# Patient Record
Sex: Female | Born: 2000
Health system: Southern US, Community
[De-identification: ages and names within clinical notes are randomized; demographics above are authoritative.]

## PROBLEM LIST (undated history)

## (undated) DIAGNOSIS — J45909 Unspecified asthma, uncomplicated: Secondary | ICD-10-CM

---

## 2018-05-16 ENCOUNTER — Encounter (HOSPITAL_COMMUNITY): Payer: Self-pay

## 2018-05-16 ENCOUNTER — Emergency Department (HOSPITAL_COMMUNITY): Payer: Self-pay

## 2018-05-16 ENCOUNTER — Emergency Department (HOSPITAL_COMMUNITY)
Admission: EM | Admit: 2018-05-16 | Discharge: 2018-05-16 | Disposition: A | Discharge status: Home / Self Care | Payer: Self-pay | Attending: Emergency Medicine | Admitting: Emergency Medicine

## 2018-05-16 DIAGNOSIS — Y999 Unspecified external cause status: Secondary | ICD-10-CM | POA: Insufficient documentation

## 2018-05-16 DIAGNOSIS — S90811A Abrasion, right foot, initial encounter: Secondary | ICD-10-CM | POA: Insufficient documentation

## 2018-05-16 DIAGNOSIS — J45909 Unspecified asthma, uncomplicated: Secondary | ICD-10-CM | POA: Insufficient documentation

## 2018-05-16 DIAGNOSIS — S80211A Abrasion, right knee, initial encounter: Secondary | ICD-10-CM | POA: Insufficient documentation

## 2018-05-16 DIAGNOSIS — Y9389 Activity, other specified: Secondary | ICD-10-CM | POA: Insufficient documentation

## 2018-05-16 DIAGNOSIS — T07XXXA Unspecified multiple injuries, initial encounter: Secondary | ICD-10-CM

## 2018-05-16 DIAGNOSIS — Z23 Encounter for immunization: Secondary | ICD-10-CM | POA: Insufficient documentation

## 2018-05-16 DIAGNOSIS — Y929 Unspecified place or not applicable: Secondary | ICD-10-CM | POA: Insufficient documentation

## 2018-05-16 DIAGNOSIS — S9031XA Contusion of right foot, initial encounter: Secondary | ICD-10-CM | POA: Insufficient documentation

## 2018-05-16 HISTORY — DX: Unspecified asthma, uncomplicated: J45.909

## 2018-05-16 MED ORDER — BACITRACIN ZINC 500 UNIT/GM EX OINT
TOPICAL_OINTMENT | Freq: Once | CUTANEOUS | Status: AC
  Administered 2018-05-16: 05:00:00 via TOPICAL
  Filled 2018-05-16: qty 0.9

## 2018-05-16 MED ORDER — TETANUS-DIPHTH-ACELL PERTUSSIS 5-2.5-18.5 LF-MCG/0.5 IM SUSP
0.5000 mL | Freq: Once | INTRAMUSCULAR | Status: AC
  Administered 2018-05-16: 0.5 mL via INTRAMUSCULAR
  Filled 2018-05-16: qty 0.5
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
## 2018-05-16 NOTE — Discharge Instructions (Signed)
You may alternate Tylenol 1000 mg every 6 hours as needed for pain and Ibuprofen 800 mg every 8 hours as needed for pain.  Please take Ibuprofen with food. ° °

## 2018-05-16 NOTE — ED Notes (Signed)
Patient verbalizes understanding of discharge instructions. Opportunity for questioning and answers were provided. Armband removed by staff, pt discharged from ED.  
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
## 2018-07-05 ENCOUNTER — Other Ambulatory Visit: Payer: Self-pay

## 2018-07-05 ENCOUNTER — Emergency Department (HOSPITAL_COMMUNITY)
Admission: EM | Admit: 2018-07-05 | Discharge: 2018-07-05 | Disposition: A | Discharge status: Home / Self Care | Payer: PRIVATE HEALTH INSURANCE | Attending: Emergency Medicine | Admitting: Emergency Medicine

## 2018-07-05 ENCOUNTER — Encounter (HOSPITAL_COMMUNITY): Payer: Self-pay | Admitting: Emergency Medicine

## 2018-07-05 DIAGNOSIS — L509 Urticaria, unspecified: Secondary | ICD-10-CM | POA: Diagnosis not present

## 2018-07-05 DIAGNOSIS — J45909 Unspecified asthma, uncomplicated: Secondary | ICD-10-CM | POA: Insufficient documentation

## 2018-07-05 DIAGNOSIS — R21 Rash and other nonspecific skin eruption: Secondary | ICD-10-CM | POA: Diagnosis present

## 2018-07-05 MED ORDER — DEXAMETHASONE SODIUM PHOSPHATE 10 MG/ML IJ SOLN
10.0000 mg | Freq: Once | INTRAMUSCULAR | Status: DC
  Filled 2018-07-05: qty 1

## 2018-07-05 MED ORDER — PREDNISONE 20 MG PO TABS: 40.0000 mg | ORAL_TABLET | Freq: Every day | ORAL | 0 refills | Status: AC

## 2018-07-05 NOTE — ED Provider Notes (Signed)
[facility] EMERGENCY DEPARTMENT Provider Note   CSN: [id number] Arrival date & time: 07/05/18  2208     History   Chief Complaint Chief Complaint  Patient presents with  . Urticaria    HPI Elizabeth Finley is a 17 y.o. female.  Patient presents to the emergency department with a chief complaint of hives.  She states she noticed the hives about 2 days ago.  She denies any changes in medications, soaps, detergents, or any other possible chemical or allergic contact.  She states that she had something similar at the beginning of her school semester.  She states that she has not felt excessively stressed.  She denies any wheezing, cough, shortness of breath, vomiting, or diarrhea.  She has tried taking Benadryl prior to arrival with some relief.  The hives come and go randomly.  She denies any fevers.  The history is provided by the patient. No language interpreter was used.    Past Medical History:  Diagnosis Date  . Asthma     There are no active problems to display for this patient.   History reviewed. No pertinent surgical history.   OB History   None      Home Medications    Prior to Admission medications   Not on File    Family History No family history on file.  Social History Social History   Tobacco Use  . Smoking status: Never Smoker  . Smokeless tobacco: Never Used  Substance Use Topics  . Alcohol use: Never    Frequency: Never  . Drug use: Never     Allergies   Patient has no known allergies.   Review of Systems Review of Systems  All other systems reviewed and are negative.    Physical Exam Updated Vital Signs BP 140/89 (BP Location: Right Arm)   Pulse (!) 105   Temp 98 F (36.7 C) (Oral)   Resp 17   Ht 5\' 7"  (1.702 m)   Wt 68 kg   LMP 06/21/2018 (Within Days)   SpO2 97%   BMI 23.49 kg/m   Physical Exam  Constitutional: She is oriented to person, place, and time. She appears well-developed and  well-nourished.  HENT:  Head: Normocephalic and atraumatic.  Eyes: Pupils are equal, round, and reactive to light. Conjunctivae and EOM are normal.  Neck: Normal range of motion. Neck supple.  Cardiovascular: Normal rate and regular rhythm. Exam reveals no gallop and no friction rub.  No murmur heard. Pulmonary/Chest: Effort normal and breath sounds normal. No respiratory distress. She has no wheezes. She has no rales. She exhibits no tenderness.  Abdominal: Soft. Bowel sounds are normal. She exhibits no distension and no mass. There is no tenderness. There is no rebound and no guarding.  Musculoskeletal: Normal range of motion. She exhibits no edema or tenderness.  Neurological: She is alert and oriented to person, place, and time.  Skin: Skin is warm and dry.  Hives on abdomen and upper extremities  Psychiatric: She has a normal mood and affect. Her behavior is normal. Judgment and thought content normal.  Nursing note and vitals reviewed.    ED Treatments / Results  Labs (all labs ordered are listed, but only abnormal results are displayed) Labs Reviewed - No data to display  EKG None  Radiology No results found.  Procedures Procedures (including critical care time)  Medications Ordered in ED Medications  dexamethasone (DECADRON) injection 10 mg (has no administration in time range)  Initial Impression / Assessment and Plan / ED Course  I have reviewed the triage vital signs and the nursing notes.  Pertinent labs & imaging results that were available during my care of the patient were reviewed by me and considered in my medical decision making (see chart for details).     Patient with diffuse urticaria.  No throat swelling.  No wheezing on exam.  Vital signs are stable.  Patient is in no acute distress.  No evidence of anaphylactic reaction.  Will give steroid shot and taper.  Recommend dermatology follow-up if no improvement.  Could be stress response to  school.  Final Clinical Impressions(s) / ED Diagnoses   Final diagnoses:  Urticaria    ED Discharge Orders         Ordered    predniSONE (DELTASONE) 20 MG tablet  Daily     07/05/18 2226           Roxy Horseman, PA-C 07/05/18 2228    Wynetta Fines, MD 07/05/18 2357

## 2018-07-05 NOTE — ED Notes (Signed)
Patient verbalizes understanding of discharge instructions. Opportunity for questioning and answers were provided. Armband removed by staff, pt discharged from ED home via POV.  

## 2018-07-05 NOTE — ED Triage Notes (Signed)
Pt reports hives that started 2 days ago. Pt reports a history of same in the beginning of the semester. Pt denies changing anything such a laundry detergent of her soap. Pt denies SOB.
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
## 2018-07-05 NOTE — ED Notes (Signed)
Pt refused the steroid injection stating that her parents do not want her to have it.
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
